# Patient Record
Sex: Male | Born: 1975 | Hispanic: Yes | Marital: Married | Smoking: Former smoker
Health system: Southern US, Community
[De-identification: ages and names within clinical notes are randomized; demographics above are authoritative.]

## PROBLEM LIST (undated history)

## (undated) DIAGNOSIS — I10 Essential (primary) hypertension: Secondary | ICD-10-CM

## (undated) DIAGNOSIS — R7303 Prediabetes: Secondary | ICD-10-CM

---

## 2008-10-16 ENCOUNTER — Emergency Department: Payer: Self-pay | Admitting: Internal Medicine

## 2011-02-05 ENCOUNTER — Ambulatory Visit: Payer: Self-pay | Admitting: Family Medicine

## 2018-12-01 DIAGNOSIS — N529 Male erectile dysfunction, unspecified: Secondary | ICD-10-CM | POA: Diagnosis not present

## 2018-12-01 DIAGNOSIS — R69 Illness, unspecified: Secondary | ICD-10-CM | POA: Diagnosis not present

## 2018-12-15 DIAGNOSIS — I10 Essential (primary) hypertension: Secondary | ICD-10-CM | POA: Diagnosis not present

## 2018-12-21 DIAGNOSIS — M5442 Lumbago with sciatica, left side: Secondary | ICD-10-CM | POA: Diagnosis not present

## 2018-12-21 DIAGNOSIS — M4300 Spondylolysis, site unspecified: Secondary | ICD-10-CM | POA: Diagnosis not present

## 2018-12-21 DIAGNOSIS — M545 Low back pain: Secondary | ICD-10-CM | POA: Diagnosis not present

## 2018-12-21 DIAGNOSIS — M5136 Other intervertebral disc degeneration, lumbar region: Secondary | ICD-10-CM | POA: Diagnosis not present

## 2018-12-21 DIAGNOSIS — G8929 Other chronic pain: Secondary | ICD-10-CM | POA: Diagnosis not present

## 2018-12-21 DIAGNOSIS — Q7649 Other congenital malformations of spine, not associated with scoliosis: Secondary | ICD-10-CM | POA: Diagnosis not present

## 2018-12-27 ENCOUNTER — Encounter (HOSPITAL_COMMUNITY): Payer: Self-pay | Admitting: Emergency Medicine

## 2018-12-27 ENCOUNTER — Emergency Department (HOSPITAL_COMMUNITY): Payer: 59

## 2018-12-27 ENCOUNTER — Other Ambulatory Visit: Payer: Self-pay

## 2018-12-27 ENCOUNTER — Emergency Department (HOSPITAL_COMMUNITY)
Admission: EM | Admit: 2018-12-27 | Discharge: 2018-12-28 | Disposition: A | Discharge status: Home / Self Care | Payer: 59 | Attending: Emergency Medicine | Admitting: Emergency Medicine

## 2018-12-27 DIAGNOSIS — S0081XA Abrasion of other part of head, initial encounter: Secondary | ICD-10-CM | POA: Insufficient documentation

## 2018-12-27 DIAGNOSIS — S8001XA Contusion of right knee, initial encounter: Secondary | ICD-10-CM | POA: Insufficient documentation

## 2018-12-27 DIAGNOSIS — I10 Essential (primary) hypertension: Secondary | ICD-10-CM | POA: Diagnosis not present

## 2018-12-27 DIAGNOSIS — Y92410 Unspecified street and highway as the place of occurrence of the external cause: Secondary | ICD-10-CM | POA: Diagnosis not present

## 2018-12-27 DIAGNOSIS — S8002XA Contusion of left knee, initial encounter: Secondary | ICD-10-CM | POA: Diagnosis not present

## 2018-12-27 DIAGNOSIS — Y999 Unspecified external cause status: Secondary | ICD-10-CM | POA: Insufficient documentation

## 2018-12-27 DIAGNOSIS — S8992XA Unspecified injury of left lower leg, initial encounter: Secondary | ICD-10-CM | POA: Diagnosis not present

## 2018-12-27 DIAGNOSIS — Y9389 Activity, other specified: Secondary | ICD-10-CM | POA: Diagnosis not present

## 2018-12-27 DIAGNOSIS — M25562 Pain in left knee: Secondary | ICD-10-CM | POA: Diagnosis not present

## 2018-12-27 DIAGNOSIS — R52 Pain, unspecified: Secondary | ICD-10-CM | POA: Diagnosis not present

## 2018-12-27 HISTORY — DX: Essential (primary) hypertension: I10

## 2018-12-27 MED ORDER — CYCLOBENZAPRINE HCL 10 MG PO TABS: 10.0000 mg | ORAL_TABLET | Freq: Two times a day (BID) | ORAL | 0 refills | Status: AC | PRN

## 2018-12-27 MED ORDER — IBUPROFEN 400 MG PO TABS
600.0000 mg | ORAL_TABLET | Freq: Once | ORAL | Status: AC
  Administered 2018-12-28: 600 mg via ORAL
  Filled 2018-12-27: qty 1
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
## 2018-12-28 NOTE — ED Notes (Signed)
Patient verbalizes understanding of discharge instructions. Opportunity for questioning and answers were provided. Armband removed by staff, pt discharged from ED ambulatory.   

## 2019-01-05 ENCOUNTER — Encounter: Payer: Self-pay | Admitting: Chiropractor

## 2019-01-05 ENCOUNTER — Other Ambulatory Visit: Payer: Self-pay | Admitting: Chiropractor

## 2019-01-05 ENCOUNTER — Ambulatory Visit
Admission: RE | Admit: 2019-01-05 | Discharge: 2019-01-05 | Disposition: A | Discharge status: Home / Self Care | Payer: 59 | Source: Ambulatory Visit | Attending: Chiropractor | Admitting: Chiropractor

## 2019-01-05 DIAGNOSIS — R52 Pain, unspecified: Secondary | ICD-10-CM | POA: Diagnosis not present

## 2019-01-05 DIAGNOSIS — M48061 Spinal stenosis, lumbar region without neurogenic claudication: Secondary | ICD-10-CM | POA: Diagnosis not present

## 2019-01-05 DIAGNOSIS — M419 Scoliosis, unspecified: Secondary | ICD-10-CM | POA: Diagnosis not present

## 2019-01-05 DIAGNOSIS — M542 Cervicalgia: Secondary | ICD-10-CM | POA: Diagnosis not present

## 2019-01-05 DIAGNOSIS — S199XXA Unspecified injury of neck, initial encounter: Secondary | ICD-10-CM | POA: Diagnosis not present

## 2019-01-05 DIAGNOSIS — M545 Low back pain: Secondary | ICD-10-CM | POA: Diagnosis not present

## 2019-01-05 DIAGNOSIS — S3992XA Unspecified injury of lower back, initial encounter: Secondary | ICD-10-CM | POA: Diagnosis not present

## 2019-01-05 DIAGNOSIS — S299XXA Unspecified injury of thorax, initial encounter: Secondary | ICD-10-CM | POA: Diagnosis not present

## 2019-02-27 DIAGNOSIS — U071 COVID-19: Secondary | ICD-10-CM | POA: Diagnosis not present

## 2019-02-27 DIAGNOSIS — Z03818 Encounter for observation for suspected exposure to other biological agents ruled out: Secondary | ICD-10-CM | POA: Diagnosis not present

## 2019-03-12 ENCOUNTER — Ambulatory Visit: Payer: 59 | Attending: Internal Medicine

## 2019-03-12 DIAGNOSIS — Z20822 Contact with and (suspected) exposure to covid-19: Secondary | ICD-10-CM | POA: Diagnosis not present

## 2019-03-13 LAB — NOVEL CORONAVIRUS, NAA: SARS-CoV-2, NAA: NOT DETECTED

## 2019-03-14 ENCOUNTER — Telehealth: Payer: Self-pay | Admitting: General Practice

## 2019-03-14 NOTE — Telephone Encounter (Signed)
Negative COVID results given. Patient results "NOT Detected." Caller expressed understanding. ° °

## 2019-06-01 ENCOUNTER — Ambulatory Visit: Payer: 59 | Attending: Internal Medicine

## 2019-06-01 DIAGNOSIS — Z23 Encounter for immunization: Secondary | ICD-10-CM

## 2019-06-01 NOTE — Progress Notes (Signed)
   Covid-19 Vaccination Clinic  Name:  Wasyl Dornfeld    MRN: [id number] DOB: 1976/01/06  06/01/2019  Mr. Keelin was observed post Covid-19 immunization for 15 minutes without incident. He was provided with Vaccine Information Sheet and instruction to access the V-Safe system. Medical Interpreter used.  Mr. Fedora was instructed to call 911 with any severe reactions post vaccine: Marland Kitchen Difficulty breathing  . Swelling of face and throat  . A fast heartbeat  . A bad rash all over body  . Dizziness and weakness   Immunizations Administered    Name Date Dose VIS Date Route   Pfizer COVID-19 Vaccine 06/01/2019  5:39 PM 0.3 mL 02/09/2019 Intramuscular   Manufacturer: [employer], [employer]   Lot: [id number]   NDC: [address]-6

## 2019-06-22 ENCOUNTER — Ambulatory Visit: Payer: 59 | Attending: Oncology

## 2019-06-22 DIAGNOSIS — Z23 Encounter for immunization: Secondary | ICD-10-CM

## 2019-06-22 NOTE — Progress Notes (Signed)
   Covid-19 Vaccination Clinic  Name:  Joshua Buck    MRN: [id number] DOB: 01/15/1976  06/22/2019  Mr. [name] was observed post Covid-19 immunization for 15 minutes without incident. He was provided with Vaccine Information Sheet and instruction to access the V-Safe system.   Mr. [name] was instructed to call 911 with any severe reactions post vaccine: Marland Kitchen Difficulty breathing  . Swelling of face and throat  . A fast heartbeat  . A bad rash all over body  . Dizziness and weakness   Immunizations Administered    Name Date Dose VIS Date Route   Pfizer COVID-19 Vaccine 06/22/2019  4:30 PM 0.3 mL 04/25/2018 Intramuscular   Manufacturer: [employer], [employer]   Lot: [id number]   NDC: [address]-5
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
## 2022-10-07 ENCOUNTER — Encounter: Payer: Self-pay | Admitting: Gastroenterology

## 2022-10-14 ENCOUNTER — Encounter: Payer: Self-pay | Admitting: Gastroenterology

## 2022-10-14 NOTE — H&P (Signed)
Pre-Procedure H&P   Patient ID: Joshua Buck is a 47 y.o. male.  Gastroenterology Provider: Jaynie Collins, DO  Referring Provider: Tawni Pummel, PA PCP: Cyndia Diver, PA-C  Date: 10/15/2022  HPI Joshua Buck is a 47 y.o. male who presents today for Colonoscopy for Colorectal cancer screening .  Patient undergoing initial colorectal cancer screening.  He reports regular bowel movements without melena hematochezia diarrhea constipation or pain.  Weight and appetite stable.  Family history of colorectal cancer in his maternal uncle.  No other family history of colon cancer or colon polyps.  H. pylori breath testing negative creatinine 0.8 hemoglobin 15.4 MCV 91.6 platelets 263,000   Past Medical History:  Diagnosis Date   Hypertension    Pre-diabetes     History reviewed. No pertinent surgical history.  Family History Maternal uncle-CRC No h/o GI disease or malignancy  Review of Systems  Constitutional:  Negative for activity change, appetite change, chills, diaphoresis, fatigue, fever and unexpected weight change.  HENT:  Negative for trouble swallowing and voice change.   Respiratory:  Negative for shortness of breath and wheezing.   Cardiovascular:  Negative for chest pain, palpitations and leg swelling.  Gastrointestinal:  Negative for abdominal distention, abdominal pain, anal bleeding, blood in stool, constipation, diarrhea, nausea and vomiting.  Musculoskeletal:  Negative for arthralgias and myalgias.  Skin:  Negative for color change and pallor.  Neurological:  Negative for dizziness, syncope and weakness.  Psychiatric/Behavioral:  Negative for confusion. The patient is not nervous/anxious.   All other systems reviewed and are negative.    Medications No current facility-administered medications on file prior to encounter.   Current Outpatient Medications on File Prior to Encounter  Medication Sig Dispense Refill   pantoprazole (PROTONIX) 20 MG  tablet Take 40 mg by mouth daily.     cyclobenzaprine (FLEXERIL) 10 MG tablet Take 1 tablet (10 mg total) by mouth 2 (two) times daily as needed for muscle spasms. (Patient not taking: Reported on 10/15/2022) 20 tablet 0   diclofenac (VOLTAREN) 25 MG EC tablet Take 25 mg by mouth 2 (two) times daily. (Patient not taking: Reported on 10/15/2022)      Pertinent medications related to GI and procedure were reviewed by me with the patient prior to the procedure   Current Facility-Administered Medications:    0.9 %  sodium chloride infusion, , Intravenous, Continuous, Jaynie Collins, DO, Last Rate: 20 mL/hr at 10/15/22 1316, 1,000 mL at 10/15/22 1316  sodium chloride 1,000 mL (10/15/22 1316)       No Known Allergies Allergies were reviewed by me prior to the procedure  Objective   Body mass index is 28.39 kg/m. Vitals:   10/15/22 1301  BP: 121/85  Pulse: 80  Resp: 18  Temp: (!) 96.5 F (35.8 C)  TempSrc: Temporal  SpO2: 98%  Weight: 77.4 kg  Height: 5\' 5"  (1.651 m)     Physical Exam Vitals and nursing note reviewed.  Constitutional:      General: He is not in acute distress.    Appearance: Normal appearance. He is not ill-appearing, toxic-appearing or diaphoretic.  HENT:     Head: Normocephalic and atraumatic.     Nose: Nose normal.     Mouth/Throat:     Mouth: Mucous membranes are moist.     Pharynx: Oropharynx is clear.  Eyes:     General: No scleral icterus.    Extraocular Movements: Extraocular movements intact.  Cardiovascular:     Rate and  Rhythm: Normal rate and regular rhythm.     Heart sounds: Normal heart sounds. No murmur heard.    No friction rub. No gallop.  Pulmonary:     Effort: Pulmonary effort is normal. No respiratory distress.     Breath sounds: Normal breath sounds. No wheezing, rhonchi or rales.  Abdominal:     General: Bowel sounds are normal. There is no distension.     Palpations: Abdomen is soft.     Tenderness: There is no abdominal  tenderness. There is no guarding or rebound.  Musculoskeletal:     Cervical back: Neck supple.     Right lower leg: No edema.     Left lower leg: No edema.  Skin:    General: Skin is warm and dry.     Coloration: Skin is not jaundiced or pale.  Neurological:     General: No focal deficit present.     Mental Status: He is alert and oriented to person, place, and time. Mental status is at baseline.  Psychiatric:        Mood and Affect: Mood normal.        Behavior: Behavior normal.        Thought Content: Thought content normal.        Judgment: Judgment normal.      Assessment:  Mr. Joshua Buck is a 47 y.o. male  who presents today for Colonoscopy for Colorectal cancer screening .  Plan:  Colonoscopy with possible intervention today  Colonoscopy with possible biopsy, control of bleeding, polypectomy, and interventions as necessary has been discussed with the patient/patient representative. Informed consent was obtained from the patient/patient representative after explaining the indication, nature, and risks of the procedure including but not limited to death, bleeding, perforation, missed neoplasm/lesions, cardiorespiratory compromise, and reaction to medications. Opportunity for questions was given and appropriate answers were provided. Patient/patient representative has verbalized understanding is amenable to undergoing the procedure.   Jaynie Collins, DO  [facility]  Portions of the record may have been created with voice recognition software. Occasional wrong-word or 'sound-a-like' substitutions may have occurred due to the inherent limitations of voice recognition software.  Read the chart carefully and recognize, using context, where substitutions may have occurred.

## 2022-10-15 ENCOUNTER — Encounter
Admission: RE | Disposition: A | Discharge status: Home / Self Care | Payer: Self-pay | Source: Home / Self Care | Attending: Gastroenterology

## 2022-10-15 ENCOUNTER — Ambulatory Visit
Admission: RE | Admit: 2022-10-15 | Discharge: 2022-10-15 | Disposition: A | Discharge status: Home / Self Care | Payer: BC Managed Care – PPO | Attending: Gastroenterology | Admitting: Gastroenterology

## 2022-10-15 ENCOUNTER — Ambulatory Visit: Payer: BC Managed Care – PPO | Admitting: Anesthesiology

## 2022-10-15 ENCOUNTER — Encounter: Payer: Self-pay | Admitting: Gastroenterology

## 2022-10-15 DIAGNOSIS — Z1211 Encounter for screening for malignant neoplasm of colon: Secondary | ICD-10-CM | POA: Diagnosis not present

## 2022-10-15 DIAGNOSIS — R7303 Prediabetes: Secondary | ICD-10-CM | POA: Insufficient documentation

## 2022-10-15 DIAGNOSIS — K640 First degree hemorrhoids: Secondary | ICD-10-CM | POA: Insufficient documentation

## 2022-10-15 DIAGNOSIS — M1990 Unspecified osteoarthritis, unspecified site: Secondary | ICD-10-CM | POA: Diagnosis not present

## 2022-10-15 DIAGNOSIS — Z87891 Personal history of nicotine dependence: Secondary | ICD-10-CM | POA: Diagnosis not present

## 2022-10-15 DIAGNOSIS — M545 Low back pain, unspecified: Secondary | ICD-10-CM | POA: Insufficient documentation

## 2022-10-15 DIAGNOSIS — G8929 Other chronic pain: Secondary | ICD-10-CM | POA: Diagnosis not present

## 2022-10-15 DIAGNOSIS — Z800 Family history of malignant neoplasm of digestive organs: Secondary | ICD-10-CM | POA: Insufficient documentation

## 2022-10-15 HISTORY — PX: COLONOSCOPY WITH PROPOFOL: SHX5780

## 2022-10-15 HISTORY — DX: Prediabetes: R73.03

## 2022-10-15 LAB — GLUCOSE, CAPILLARY: Glucose-Capillary: 83 mg/dL (ref 70–99)

## 2022-10-15 SURGERY — COLONOSCOPY WITH PROPOFOL
Anesthesia: General

## 2022-10-15 MED ORDER — PROPOFOL 10 MG/ML IV BOLUS
INTRAVENOUS | Status: DC | PRN
  Administered 2022-10-15: 150 ug/kg/min via INTRAVENOUS
  Administered 2022-10-15 (×2): 50 mg via INTRAVENOUS

## 2022-10-15 MED ORDER — SODIUM CHLORIDE 0.9 % IV SOLN
INTRAVENOUS | Status: DC
  Administered 2022-10-15: 1000 mL via INTRAVENOUS

## 2022-10-15 NOTE — Op Note (Signed)
[facility] Gastroenterology Patient Name: Joshua Buck Procedure Date: 10/15/2022 1:17 PM MRN: [id number] Account #: [id number] Date of Birth: March 22, 1975 Admit Type: Outpatient Age: 47 Room: [facility] ENDO ROOM 1 Gender: Male Note Status: Finalized Instrument Name: Colonoscope [id number] Procedure:             Colonoscopy Indications:           Screening for colorectal malignant neoplasm Providers:             Jaynie Collins DO, DO Medicines:             Monitored Anesthesia Care Complications:         No immediate complications. Estimated blood loss: None. Procedure:             Pre-Anesthesia Assessment:                        - Prior to the procedure, a History and Physical was                         performed, and patient medications and allergies were                         reviewed. The patient is competent. The risks and                         benefits of the procedure and the sedation options and                         risks were discussed with the patient. All questions                         were answered and informed consent was obtained.                         Patient identification and proposed procedure were                         verified by the physician, the nurse, the anesthetist                         and the technician in the endoscopy suite. Mental                         Status Examination: alert and oriented. Airway                         Examination: normal oropharyngeal airway and neck                         mobility. Respiratory Examination: clear to                         auscultation. CV Examination: RRR, no murmurs, no S3                         or S4. Prophylactic Antibiotics: The patient does not  require prophylactic antibiotics. Prior                         Anticoagulants: The patient has taken no anticoagulant                         or antiplatelet agents. ASA Grade Assessment: II - A                          patient with mild systemic disease. After reviewing                         the risks and benefits, the patient was deemed in                         satisfactory condition to undergo the procedure. The                         anesthesia plan was to use monitored anesthesia care                         (MAC). Immediately prior to administration of                         medications, the patient was re-assessed for adequacy                         to receive sedatives. The heart rate, respiratory                         rate, oxygen saturations, blood pressure, adequacy of                         pulmonary ventilation, and response to care were                         monitored throughout the procedure. The physical                         status of the patient was re-assessed after the                         procedure.                        After obtaining informed consent, the colonoscope was                         passed under direct vision. Throughout the procedure,                         the patient's blood pressure, pulse, and oxygen                         saturations were monitored continuously. The                         Colonoscope was introduced through the anus and  advanced to the the terminal ileum, with                         identification of the appendiceal orifice and IC                         valve. The colonoscopy was performed without                         difficulty. The patient tolerated the procedure well.                         The quality of the bowel preparation was evaluated                         using the BBPS [facility] Bowel Preparation Scale) with                         scores of: Right Colon = 3, Transverse Colon = 3 and                         Left Colon = 3 (entire mucosa seen well with no                         residual staining, small fragments of stool or opaque                         liquid). The total BBPS  score equals 9. The terminal                         ileum, ileocecal valve, appendiceal orifice, and                         rectum were photographed. Findings:      The terminal ileum appeared normal. Estimated blood loss: none.      Retroflexion in the right colon was performed.      Non-bleeding internal hemorrhoids were found during retroflexion. The       hemorrhoids were Grade I (internal hemorrhoids that do not prolapse).       Estimated blood loss: none.      The entire examined colon appeared normal on direct and retroflexion       views. Impression:            - The examined portion of the ileum was normal.                        - Non-bleeding internal hemorrhoids.                        - The entire examined colon is normal on direct and                         retroflexion views.                        - No specimens collected. Recommendation:        - Patient has a contact number available for  emergencies. The signs and symptoms of potential                         delayed complications were discussed with the patient.                         Return to normal activities tomorrow. Written                         discharge instructions were provided to the patient.                        - Discharge patient to home.                        - Resume previous diet.                        - Continue present medications.                        - Repeat colonoscopy in 10 years for screening                         purposes.                        - Return to referring physician as previously                         scheduled.                        - The findings and recommendations were discussed with                         the patient. Procedure Code(s):     --- Professional ---                        [phone], Colonoscopy, flexible; diagnostic, including                         collection of specimen(s) by brushing or washing, when                          performed (separate procedure) Diagnosis Code(s):     --- Professional ---                        Z12.11, Encounter for screening for malignant neoplasm                         of colon                        K64.0, First degree hemorrhoids CPT copyright 2022 [employer]. All rights reserved. The codes documented in this report are preliminary and upon coder review may  be revised to meet current compliance requirements. Attending Participation:      I personally performed the entire procedure. Elfredia Nevins, DO Jaynie Collins DO, DO 10/15/2022 1:56:28 PM This report has been signed electronically. Number of  Addenda: 0 Note Initiated On: 10/15/2022 1:17 PM Scope Withdrawal Time: 0 hours 9 minutes 53 seconds  Total Procedure Duration: 0 hours 15 minutes 54 seconds  Estimated Blood Loss:  Estimated blood loss: none.      [facility]

## 2022-10-15 NOTE — Interval H&P Note (Signed)
History and Physical Interval Note: Preprocedure H&P from 10/15/22  was reviewed and there was no interval change after seeing and examining the patient.  Written consent was obtained from the patient after discussion of risks, benefits, and alternatives. Patient has consented to proceed with Colonoscopy with possible intervention    10/15/2022 1:21 PM  [name]  has presented today for surgery, with the diagnosis of V76.51 (ICD-9-CM) - Z12.11 (ICD-10-CM) - Colon cancer screening.  The various methods of treatment have been discussed with the patient and family. After consideration of risks, benefits and other options for treatment, the patient has consented to  Procedure(s): COLONOSCOPY WITH PROPOFOL (N/A) as a surgical intervention.  The patient's history has been reviewed, patient examined, no change in status, stable for surgery.  I have reviewed the patient's chart and labs.  Questions were answered to the patient's satisfaction.     Joshua Buck

## 2022-10-15 NOTE — Transfer of Care (Signed)
Immediate Anesthesia Transfer of Care Note  Patient: Joshua Buck  Procedure(s) Performed: COLONOSCOPY WITH PROPOFOL  Patient Location: PACU  Anesthesia Type:General  Level of Consciousness: drowsy  Airway & Oxygen Therapy: Patient Spontanous Breathing  Post-op Assessment: Report given to RN and Post -op Vital signs reviewed and stable  Post vital signs: stable  Last Vitals:  Vitals Value Taken Time  BP    Temp    Pulse 67 10/15/22 1352  Resp 14 10/15/22 1352  SpO2 99 % 10/15/22 1352  Vitals shown include unfiled device data.  Last Pain:  Vitals:   10/15/22 1301  TempSrc: Temporal  PainSc: 0-No pain         Complications: No notable events documented.

## 2022-10-15 NOTE — Anesthesia Preprocedure Evaluation (Signed)
Anesthesia Evaluation  Patient identified by MRN, date of birth, ID band Patient awake    Reviewed: Allergy & Precautions, NPO status , Patient's Chart, lab work & pertinent test results  History of Anesthesia Complications Negative for: history of anesthetic complications  Airway Mallampati: I   Neck ROM: Full    Dental no notable dental hx.    Pulmonary former smoker (quit 7 years ago)   Pulmonary exam normal breath sounds clear to auscultation       Cardiovascular Exercise Tolerance: Good negative cardio ROS Normal cardiovascular exam Rhythm:Regular Rate:Normal     Neuro/Psych Chronic lower back pain    GI/Hepatic negative GI ROS,,,  Endo/Other  Prediabetes   Renal/GU negative Renal ROS     Musculoskeletal  (+) Arthritis ,    Abdominal   Peds  Hematology negative hematology ROS (+)   Anesthesia Other Findings   Reproductive/Obstetrics                              Anesthesia Physical Anesthesia Plan  ASA: 2  Anesthesia Plan: General   Post-op Pain Management:    Induction: Intravenous  PONV Risk Score and Plan: 2 and Propofol infusion, TIVA and Treatment may vary due to age or medical condition  Airway Management Planned: Natural Airway  Additional Equipment:   Intra-op Plan:   Post-operative Plan:   Informed Consent: I have reviewed the patients History and Physical, chart, labs and discussed the procedure including the risks, benefits and alternatives for the proposed anesthesia with the patient or authorized representative who has indicated his/her understanding and acceptance.       Plan Discussed with: CRNA  Anesthesia Plan Comments: (LMA/GETA backup discussed.  Patient consented for risks of anesthesia including but not limited to:  - adverse reactions to medications - damage to eyes, teeth, lips or other oral mucosa - nerve damage due to positioning  -  sore throat or hoarseness - damage to heart, brain, nerves, lungs, other parts of body or loss of life  Informed patient about role of CRNA in peri- and intra-operative care.  Patient voiced understanding.)         Anesthesia Quick Evaluation

## 2022-10-25 NOTE — Anesthesia Postprocedure Evaluation (Signed)
Anesthesia Post Note  Patient: Dedan Jacobowitz  Procedure(s) Performed: COLONOSCOPY WITH PROPOFOL  Patient location during evaluation: PACU Anesthesia Type: General Level of consciousness: awake and alert, oriented and patient cooperative Pain management: pain level controlled Vital Signs Assessment: post-procedure vital signs reviewed and stable Respiratory status: spontaneous breathing, nonlabored ventilation and respiratory function stable Cardiovascular status: blood pressure returned to baseline and stable Postop Assessment: adequate PO intake Anesthetic complications: no   There were no known notable events for this encounter.   Last Vitals:  Vitals:   10/15/22 1404 10/15/22 1418  BP: 112/71 (!) 121/92  Pulse: 62 63  Resp: 14 19  Temp:    SpO2: 100% 99%    Last Pain:  Vitals:   10/16/22 1212  TempSrc:   PainSc: 0-No pain                 Reed Breech
